# Patient Record
Sex: Male | Born: 1965 | Race: White | Hispanic: No | Marital: Married
Health system: Southern US, Community
[De-identification: ages and names within clinical notes are randomized; demographics above are authoritative.]

---

## 2020-12-01 ENCOUNTER — Other Ambulatory Visit: Payer: Self-pay

## 2020-12-01 ENCOUNTER — Emergency Department (HOSPITAL_COMMUNITY)
Admission: EM | Admit: 2020-12-01 | Discharge: 2020-12-02 | Disposition: A | Discharge status: Home / Self Care | Payer: BC Managed Care – PPO | Attending: Emergency Medicine | Admitting: Emergency Medicine

## 2020-12-01 DIAGNOSIS — N200 Calculus of kidney: Secondary | ICD-10-CM | POA: Insufficient documentation

## 2020-12-01 DIAGNOSIS — N4889 Other specified disorders of penis: Secondary | ICD-10-CM | POA: Insufficient documentation

## 2020-12-01 DIAGNOSIS — N138 Other obstructive and reflux uropathy: Secondary | ICD-10-CM

## 2020-12-01 DIAGNOSIS — N211 Calculus in urethra: Secondary | ICD-10-CM | POA: Diagnosis not present

## 2020-12-01 DIAGNOSIS — R339 Retention of urine, unspecified: Secondary | ICD-10-CM | POA: Diagnosis present

## 2020-12-01 MED ORDER — LIDOCAINE HCL URETHRAL/MUCOSAL 2 % EX GEL
1.0000 "application " | Freq: Once | CUTANEOUS | Status: AC
  Administered 2020-12-01: 1 via TOPICAL
  Filled 2020-12-01: qty 11

## 2020-12-01 MED ORDER — HYDROMORPHONE HCL 1 MG/ML IJ SOLN
1.0000 mg | Freq: Once | INTRAMUSCULAR | Status: AC
  Administered 2020-12-01: 1 mg via INTRAMUSCULAR
  Filled 2020-12-01: qty 1

## 2020-12-01 NOTE — ED Triage Notes (Signed)
Pt reported to ED with c/o of passing kidney stone x past week that has moved to tip of penis and obstructing urine flow.

## 2020-12-01 NOTE — ED Provider Notes (Signed)
Emergency Medicine Provider Triage Evaluation Note  Joshua Graham , a 55 y.o. male  was evaluated in triage.  Pt complains of KS stuck in urethral meatus.  Can't pass urine.  Complains of significant pain with manipulation of the stone.  Doesn't know how big the stone is.  Review of Systems  Positive: Penis pain, hematuria Negative: Fever, chills  Physical Exam  BP (!) 154/92 (BP Location: Right Arm)   Pulse 70   Temp 98 F (36.7 C) (Oral)   Resp 18   SpO2 98%  Gen:   Awake, no distress   Resp:  Normal effort  MSK:   Moves extremities without difficulty  Other:  Stone blocking urethral meatus  Medical Decision Making  Medically screening exam initiated at 10:32 PM.  Appropriate orders placed.  Keshav Winegar was informed that the remainder of the evaluation will be completed by another provider, this initial triage assessment does not replace that evaluation, and the importance of remaining in the ED until their evaluation is complete.  Foreign body   Roxy Horseman, PA-C 12/01/20 2254    Charlynne Pander, MD 12/01/20 [phone]

## 2020-12-01 NOTE — ED Provider Notes (Addendum)
[facility] EMERGENCY DEPARTMENT Provider Note  CSN: [id number] Arrival date & time: 12/01/20 2027  Chief Complaint(s) No chief complaint on file.  HPI Joshua Graham is a 55 y.o. male patient presents with a kidney stone stuck in the urethral meatus.  He was diagnosed with renal stone 1 week ago and has been passing it since.  While trying to pass today he got stuck at the meatus.  He is endorsing severe pain at the tip of the penis.  Worse with palpation.  Unable to urinate.  No swelling or redness.  No nausea or vomiting.  No fevers or chills.  No other physical complaints  HPI  Past Medical History No past medical history on file. There are no problems to display for this patient.  Home Medication(s) Prior to Admission medications   Medication Sig Start Date End Date Taking? Authorizing Provider  albuterol (VENTOLIN HFA) 108 (90 Base) MCG/ACT inhaler Inhale 1-2 puffs into the lungs every 6 (six) hours as needed for wheezing or shortness of breath.   Yes [provider]  Cholecalciferol (VITAMIN D3) 1.25 MG (50000 UT) CAPS Take 50,000 Units by mouth every 14 (fourteen) days. 07/06/20  Yes [provider]  cyclobenzaprine (FLEXERIL) 10 MG tablet Take 10 mg by mouth 2 (two) times daily as needed for muscle spasms.   Yes [provider]  Docusate Calcium (STOOL SOFTENER PO) Take 1 capsule by mouth daily.   Yes [provider]  fluticasone (FLONASE) 50 MCG/ACT nasal spray Place 1 spray into both nostrils daily as needed for allergies or rhinitis.   Yes [provider]  ibuprofen (ADVIL) 200 MG tablet Take 800 mg by mouth every 6 (six) hours as needed for headache or moderate pain.   Yes [provider]  ipratropium (ATROVENT) 0.06 % nasal spray Place 1 spray into both nostrils 2 (two) times daily as needed for rhinitis.   Yes [provider]  losartan (COZAAR) 100 MG tablet Take 100 mg by mouth daily. 11/15/20   Yes [provider]  metoprolol succinate (TOPROL-XL) 50 MG 24 hr tablet Take 25 mg by mouth every evening. 07/24/20  Yes [provider]  Omega 3-6-9 Fatty Acids (OMEGA-3-6-9 PO) Take 1 capsule by mouth in the morning and at bedtime.   Yes [provider]  omeprazole (PRILOSEC) 20 MG capsule Take 20 mg by mouth daily.   Yes [provider]  predniSONE (RAYOS) 5 MG TBEC Take 5 mg by mouth in the morning and at bedtime.   Yes [provider]  SUMAtriptan (IMITREX) 50 MG tablet Take 50 mg by mouth daily as needed for migraine or headache. 08/16/20  Yes [provider]  tamsulosin (FLOMAX) 0.4 MG CAPS capsule Take 0.8 mg by mouth daily. 11/05/20  Yes [provider]  Turmeric (QC TUMERIC COMPLEX PO) Take 1 capsule by mouth daily.   Yes [provider]  vitamin C (ASCORBIC ACID) 500 MG tablet Take 500 mg by mouth daily.   Yes [provider]  Past Surgical History ** The histories are not reviewed yet. Please review them in the "History" navigator section and refresh this SmartLink. Family History No family history on file.  Social History   Allergies Gabapentin, Aspirin, Morphine and related, Oxycodone, and Penicillins  Review of Systems Review of Systems All other systems are reviewed and are negative for acute change except as noted in the HPI  Physical Exam Vital Signs  I have reviewed the triage vital signs BP (!) 192/81 (BP Location: Right Arm)   Pulse 78   Temp 98 F (36.7 C) (Oral)   Resp 20   Ht 5\' 8"  (1.727 m)   Wt 90.7 kg   SpO2 96%   BMI 30.41 kg/m   Physical Exam Vitals reviewed.  Constitutional:      General: He is not in acute distress.    Appearance: He is well-developed. He is not diaphoretic.  HENT:     Head: Normocephalic and atraumatic.     Right Ear:  External ear normal.     Left Ear: External ear normal.     Nose: Nose normal.     Mouth/Throat:     Mouth: Mucous membranes are moist.  Eyes:     General: No scleral icterus.    Conjunctiva/sclera: Conjunctivae normal.  Neck:     Trachea: Phonation normal.  Cardiovascular:     Rate and Rhythm: Normal rate and regular rhythm.  Pulmonary:     Effort: Pulmonary effort is normal. No respiratory distress.     Breath sounds: No stridor.  Abdominal:     General: There is no distension.  Genitourinary:    Penis: Hypospadias present.      Comments: Stone partly exposed at the urethral meatus. Stuck and obstructing the exit. Musculoskeletal:        General: Normal range of motion.     Cervical back: Normal range of motion.  Neurological:     Mental Status: He is alert and oriented to person, place, and time.  Psychiatric:        Behavior: Behavior normal.    ED Results and Treatments Labs (all labs ordered are listed, but only abnormal results are displayed) Labs Reviewed  URINALYSIS, ROUTINE W REFLEX MICROSCOPIC - Abnormal; Notable for the following components:      Result Value   Ketones, ur 5 (*)    All other components within normal limits  CBC WITH DIFFERENTIAL/PLATELET - Abnormal; Notable for the following components:   Platelets 140 (*)    All other components within normal limits  BASIC METABOLIC PANEL - Abnormal; Notable for the following components:   Potassium 3.1 (*)    Glucose, Bld 100 (*)    All other components within normal limits                                                                                                                         EKG  EKG Interpretation  Date/Time:    Ventricular Rate:  PR Interval:    QRS Duration:   QT Interval:    QTC Calculation:   R Axis:     Text Interpretation:         Radiology DG Pelvis 1-2 Views  Result Date: 12/02/2020 CLINICAL DATA:  Urethral stone. EXAM: PELVIS - 1-2 VIEW COMPARISON:  None.  FINDINGS: Calcification noted near the tip of the penis measures 7 x 3 mm. No additional suspicious calcifications. Nonobstructive bowel gas pattern. No acute bony abnormality. IMPRESSION: 7 x 3 mm calcification near the tip of the penis. Electronically Signed   By: Charlett NoseKevin  Dover M.D.   On: 12/02/2020 00:19   CT Renal Stone Study  Result Date: 12/02/2020 CLINICAL DATA:  Urinary tract stone EXAM: CT ABDOMEN AND PELVIS WITHOUT CONTRAST TECHNIQUE: Multidetector CT imaging of the abdomen and pelvis was performed following the standard protocol without IV contrast. COMPARISON:  None. FINDINGS: Lower chest: Lung bases are clear. No effusions. Heart is normal size. Hepatobiliary: No focal hepatic abnormality. Gallbladder unremarkable. Pancreas: No focal abnormality or ductal dilatation. Spleen: No focal abnormality.  Normal size. Adrenals/Urinary Tract: Punctate nonobstructing stone in the lower pole of the left kidney. No ureteral stones or hydronephrosis. 3.1 cm exophytic low-density lesion off the lower pole of the right kidney, likely cyst. Adrenal glands and urinary bladder unremarkable. Stomach/Bowel: Stomach, large and small bowel grossly unremarkable. Normal appendix. Few scattered colonic diverticula. Vascular/Lymphatic: No evidence of aneurysm or adenopathy. Scattered aortic atherosclerosis. Reproductive: No visible focal abnormality. Other: No free fluid or free air. Left inguinal hernia containing fat. Musculoskeletal: No acute bony abnormality. IMPRESSION: Punctate left lower pole nephrolithiasis. No ureteral stones or hydronephrosis. Aortic atherosclerosis. Left inguinal hernia containing fat. Scattered colonic diverticula.  No active diverticulitis. Electronically Signed   By: Charlett NoseKevin  Dover M.D.   On: 12/02/2020 01:07    Pertinent labs & imaging results that were available during my care of the patient were reviewed by me and considered in my medical decision making (see MDM for details).  Medications  Ordered in ED Medications  lidocaine (XYLOCAINE) 2 % jelly 1 application (1 application Topical Given by Other 12/01/20 2354)  HYDROmorphone (DILAUDID) injection 1 mg (1 mg Intramuscular Given 12/01/20 2352)                                                                                                                                     Procedures .Foreign Body Removal  Date/Time: 12/02/2020 12:44 AM Performed by: Nira Connardama, Sky Borboa Eduardo, MD Authorized by: Nira Connardama, Saphronia Ozdemir Eduardo, MD  Consent: Verbal consent obtained. Risks and benefits: risks, benefits and alternatives were discussed Consent given by: patient Patient understanding: patient states understanding of the procedure being performed Imaging studies: imaging studies available Patient identity confirmed: verbally with patient and arm band Intake: penis/urethral.  Anesthesia: Local Anesthetic: topical anesthetic  Sedation: Patient sedated: no  Patient restrained: no 3 objects recovered. Objects recovered: urinary stones Post-procedure assessment: foreign  body removed Patient tolerance: patient tolerated the procedure well with no immediate complications Comments: Patient able to void approx 150cc. Post void residual approx 140cc    (including critical care time)  Medical Decision Making / ED Course I have reviewed the nursing notes for this encounter and the patient's prior records (if available in EHR or on provided paperwork).  Jacari Iannello was evaluated in Emergency Department on 12/02/2020 for the symptoms described in the history of present illness. He was evaluated in the context of the global COVID-19 pandemic, which necessitated consideration that the patient might be at risk for infection with the SARS-CoV-2 virus that causes COVID-19. Institutional protocols and algorithms that pertain to the evaluation of patients at risk for COVID-19 are in a state of rapid change based on information released by regulatory  bodies including the CDC and federal and state organizations. These policies and algorithms were followed during the patient's care in the ED.     Obstructing stone at the urethral meatus. Patient has hypospadia with a small caliber meatus. Attempt at removal was difficult. Topical lidocaine applied. Patient given IM Dilaudid. Plain film of the pelvis obtained to assess size and shape of the stone, in order to guide extraction.  Plain film notable for 7 x 3 mm calcified stone at the tip of the penis. I was able to extracted manually. Patient able to void approximately 150 cc afterwards. Post void residual approximately 130 to 140 cc.  UA sent. Screening labs to assess renal function. CT stone study ordered.  Pertinent labs & imaging results that were available during my care of the patient were reviewed by me and considered in my medical decision making:  CBC without leukocytosis or anemia. Metabolic panel without significant electrolyte derangements or renal sufficiency. UA without evidence of infection. CT scan notable for punctate stone in the left lower renal pole. No other stones in the bladder.   Final Clinical Impression(s) / ED Diagnoses Final diagnoses:  Urinary tract obstruction by kidney stone  Urethral stone   The patient appears reasonably screened and/or stabilized for discharge and I doubt any other medical condition or other [facility] requiring further screening, evaluation, or treatment in the ED at this time prior to discharge. Safe for discharge with strict return precautions.  Disposition: Discharge  Condition: Good  I have discussed the results, Dx and Tx plan with the patient/family who expressed understanding and agree(s) with the plan. Discharge instructions discussed at length. The patient/family was given strict return precautions who verbalized understanding of the instructions. No further questions at time of discharge.    ED Discharge Orders      None        Follow Up: [facility] [address] [place] [address] [phone] Call  to schedule an appointment for close follow up     This chart was dictated using voice recognition software.  Despite best efforts to proofread,  errors can occur which can change the documentation meaning.      Nira Conn, MD 12/02/20 [phone]

## 2020-12-02 ENCOUNTER — Emergency Department (HOSPITAL_COMMUNITY): Payer: BC Managed Care – PPO

## 2020-12-02 LAB — URINALYSIS, ROUTINE W REFLEX MICROSCOPIC
Bilirubin Urine: NEGATIVE
Glucose, UA: NEGATIVE mg/dL
Hgb urine dipstick: NEGATIVE
Ketones, ur: 5 mg/dL — AB
Leukocytes,Ua: NEGATIVE
Nitrite: NEGATIVE
Protein, ur: NEGATIVE mg/dL
Specific Gravity, Urine: 1.021 (ref 1.005–1.030)
pH: 5 (ref 5.0–8.0)

## 2020-12-02 LAB — BASIC METABOLIC PANEL
Anion gap: 9 (ref 5–15)
BUN: 20 mg/dL (ref 6–20)
CO2: 26 mmol/L (ref 22–32)
Calcium: 9 mg/dL (ref 8.9–10.3)
Chloride: 105 mmol/L (ref 98–111)
Creatinine, Ser: 1.14 mg/dL (ref 0.61–1.24)
GFR, Estimated: >60 mL/min (ref >60)
Glucose, Bld: 100 mg/dL — ABNORMAL HIGH (ref 70–99)
Potassium: 3.1 mmol/L — ABNORMAL LOW (ref 3.5–5.1)
Sodium: 140 mmol/L (ref 135–145)

## 2020-12-02 LAB — CBC WITH DIFFERENTIAL/PLATELET
Abs Immature Granulocytes: 0.02 10*3/uL (ref 0.00–0.07)
Basophils Absolute: 0 10*3/uL (ref 0.0–0.1)
Basophils Relative: 0 %
Eosinophils Absolute: 0.1 10*3/uL (ref 0.0–0.5)
Eosinophils Relative: 2 %
HCT: 39.9 % (ref 39.0–52.0)
Hemoglobin: 13.7 g/dL (ref 13.0–17.0)
Immature Granulocytes: 0 %
Lymphocytes Relative: 33 %
Lymphs Abs: 2.1 10*3/uL (ref 0.7–4.0)
MCH: 32.3 pg (ref 26.0–34.0)
MCHC: 34.3 g/dL (ref 30.0–36.0)
MCV: 94.1 fL (ref 80.0–100.0)
Monocytes Absolute: 0.5 10*3/uL (ref 0.1–1.0)
Monocytes Relative: 8 %
Neutro Abs: 3.5 10*3/uL (ref 1.7–7.7)
Neutrophils Relative %: 57 %
Platelets: 140 10*3/uL — ABNORMAL LOW (ref 150–400)
RBC: 4.24 MIL/uL (ref 4.22–5.81)
RDW: 12.5 % (ref 11.5–15.5)
WBC: 6.3 10*3/uL (ref 4.0–10.5)
nRBC: 0 % (ref 0.0–0.2)

## 2020-12-02 NOTE — ED Notes (Signed)
146mg  BLADDER SCAN

## 2020-12-02 NOTE — ED Notes (Signed)
Provider at bedside with patient.

## 2022-12-18 IMAGING — DX DG PELVIS 1-2V
2 series · 2 of 2 positions shown · non-contrast
Comparison: None.

CLINICAL DATA: Urethral stone.

EXAM:
PELVIS - 1-2 VIEW

[pelvis ap (1 of 2)]
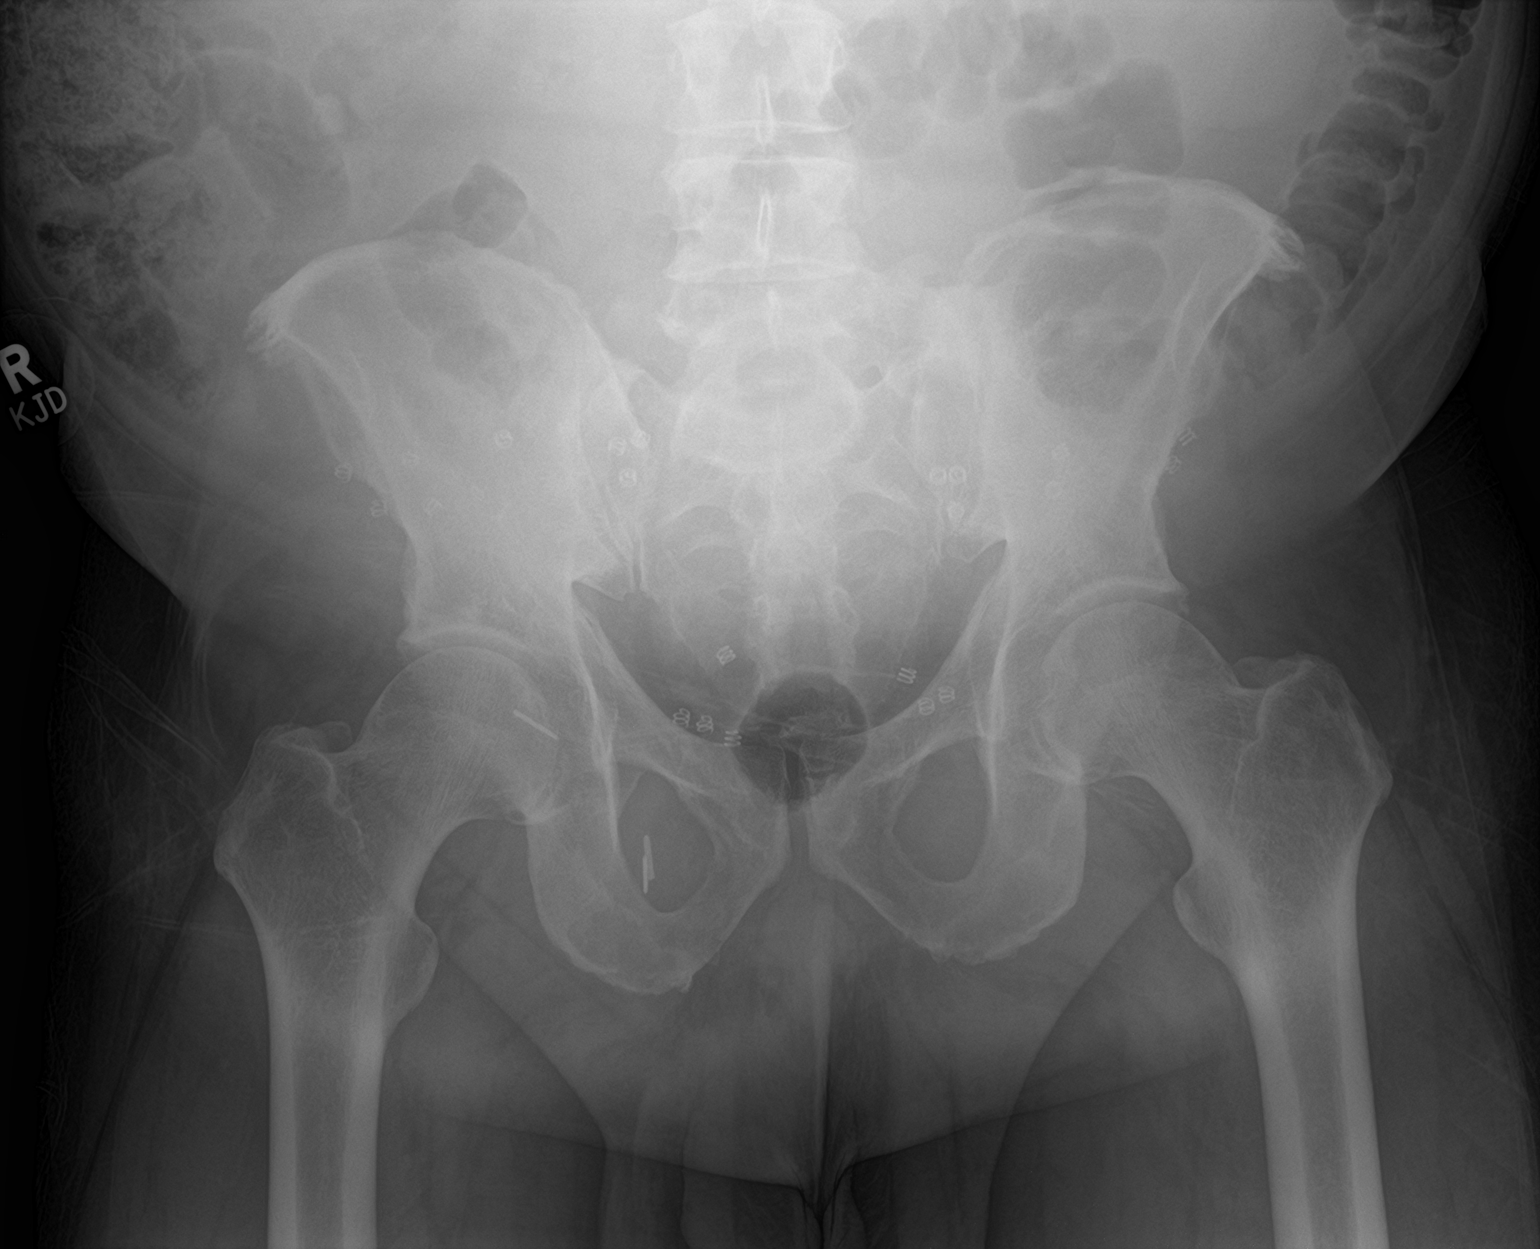

[pelvis ap (2 of 2)]
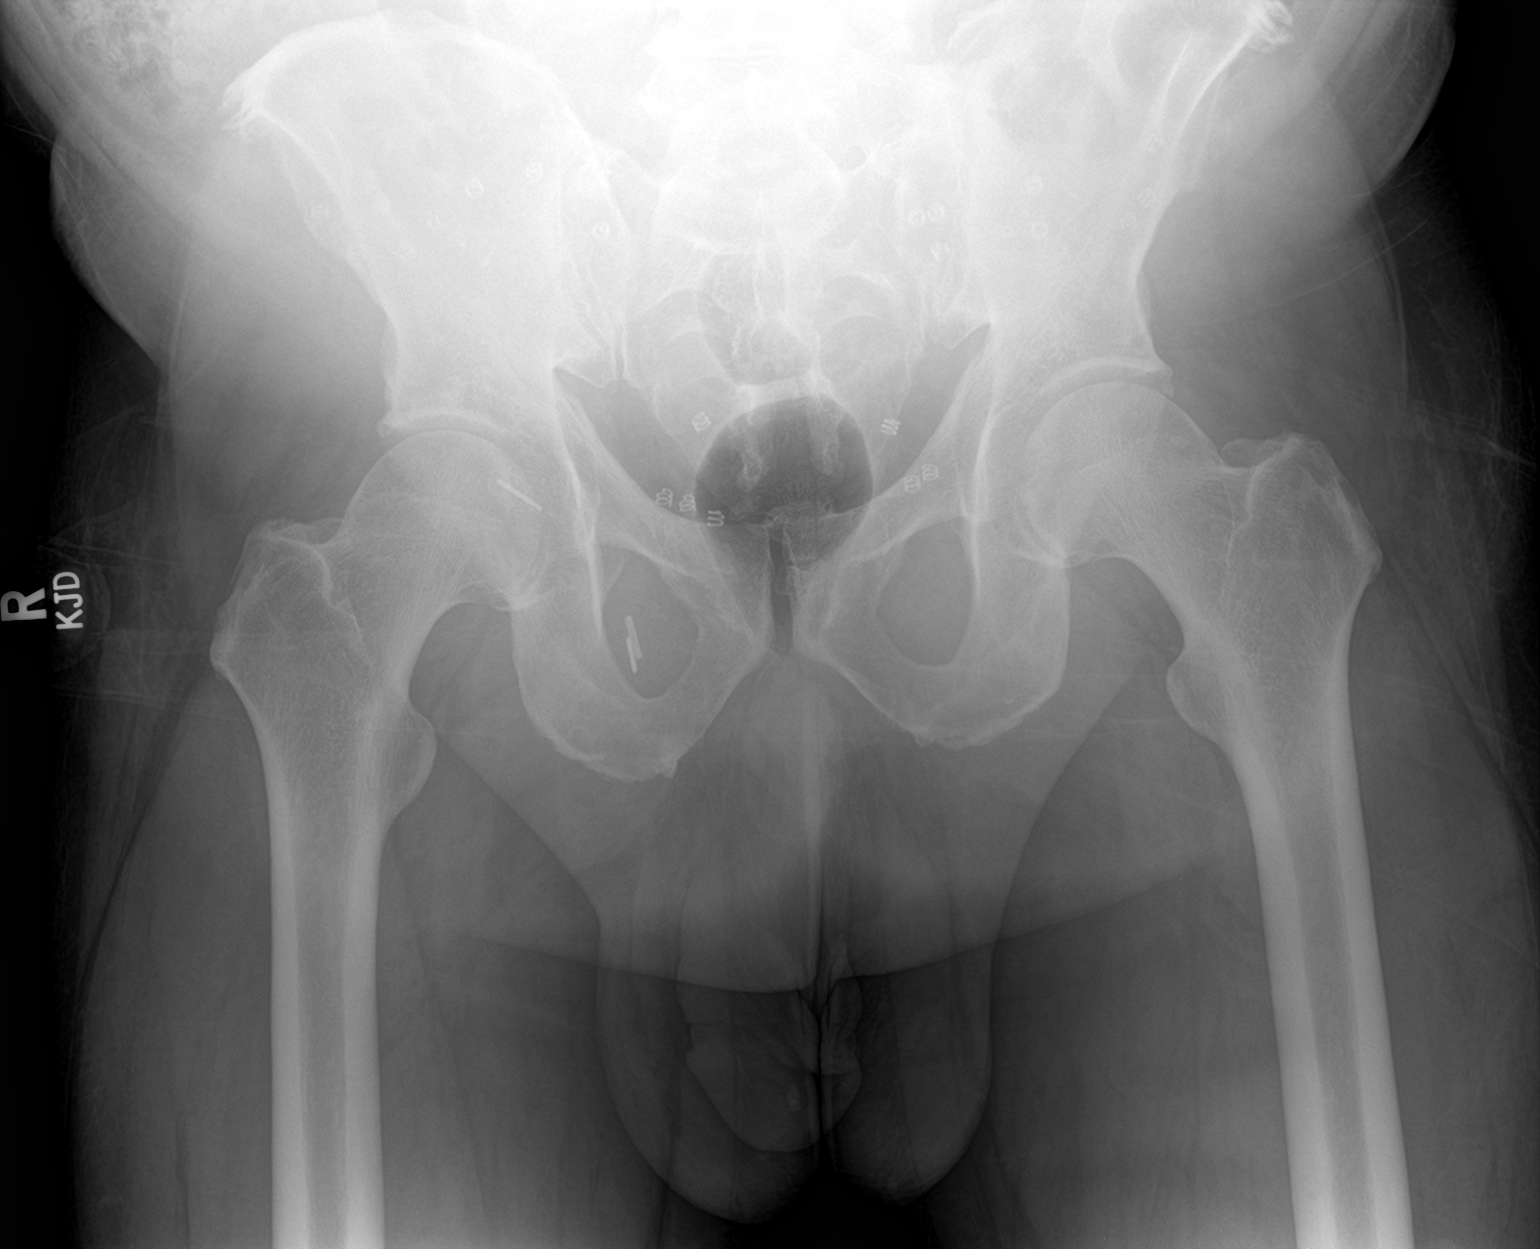

[2 of 2 positions shown; findings below may reference images not displayed]

FINDINGS: Calcification noted near the tip of the penis measures 7 x 3 mm. No
additional suspicious calcifications. Nonobstructive bowel gas
pattern. No acute bony abnormality.
IMPRESSION: 7 x 3 mm calcification near the tip of the penis.

## 2022-12-19 IMAGING — CT CT RENAL STONE PROTOCOL
2 of 4 series · 16 of 46 positions shown, 18 images · non-contrast
Comparison: None.

CLINICAL DATA: Urinary tract stone

EXAM:
CT ABDOMEN AND PELVIS WITHOUT CONTRAST
TECHNIQUE: Multidetector CT imaging of the abdomen and pelvis was performed
following the standard protocol without IV contrast.

[Series 3: ap without · axial · non-contrast · 0.87mm/px · z∈[+704,+1159]mm · 13 of 103 slices shown, 15 images]
[im 6/103  soft-tissue]
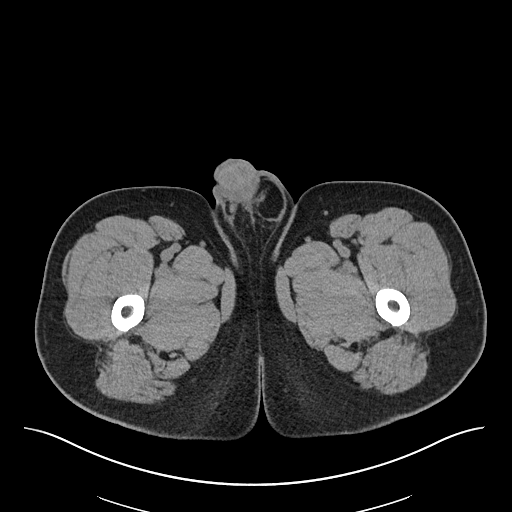
[im 6/103  bone]
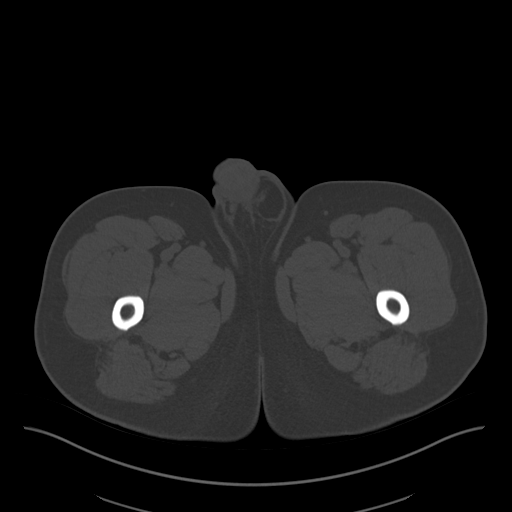
[im 12/103  soft-tissue]
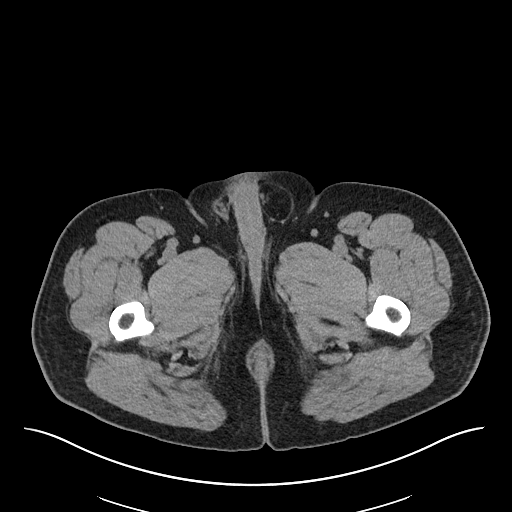
[im 23/103  soft-tissue]
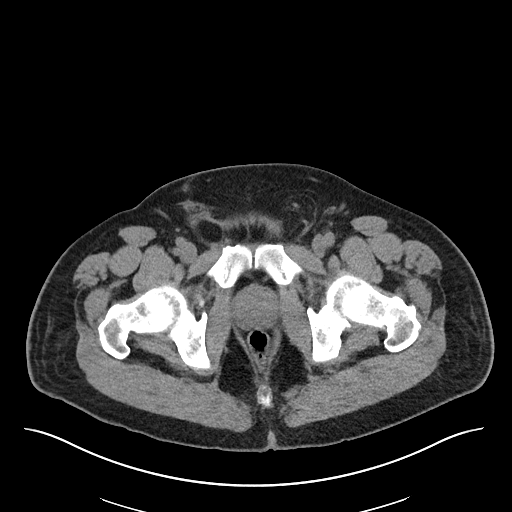
[im 29/103  soft-tissue]
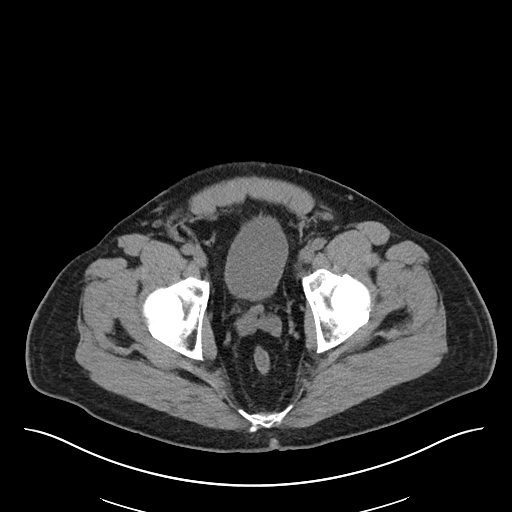
[im 35/103  soft-tissue]
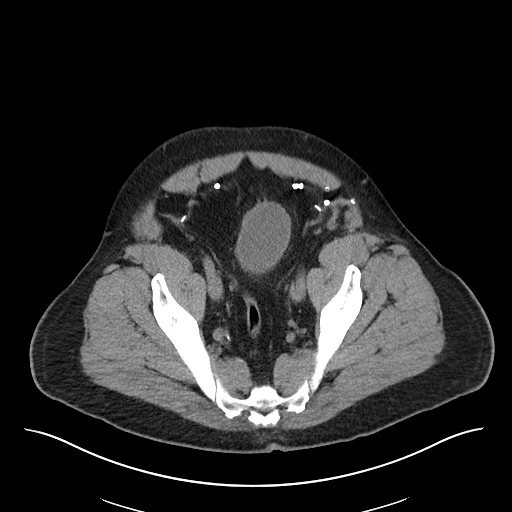
[im 46/103  soft-tissue]
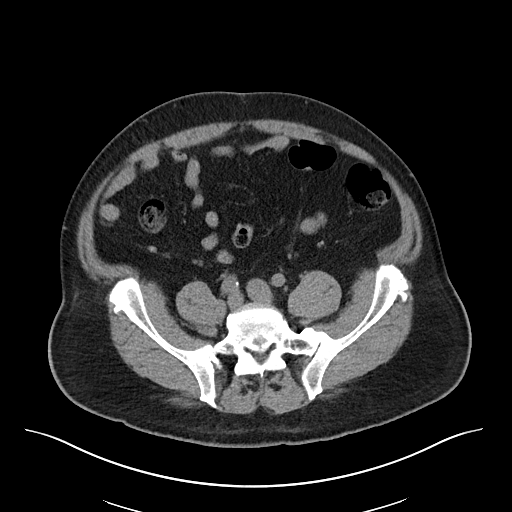
[im 52/103  soft-tissue]
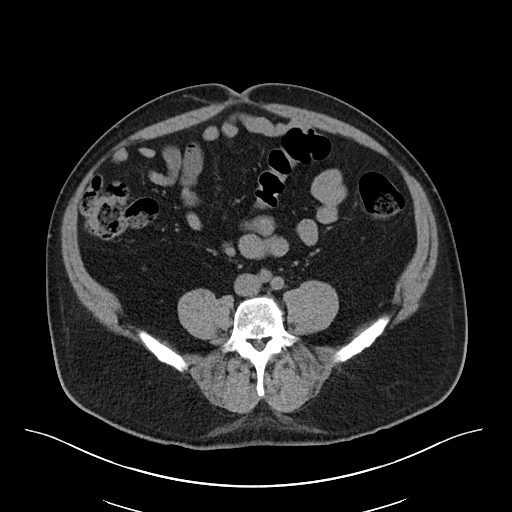
[im 57/103  soft-tissue]
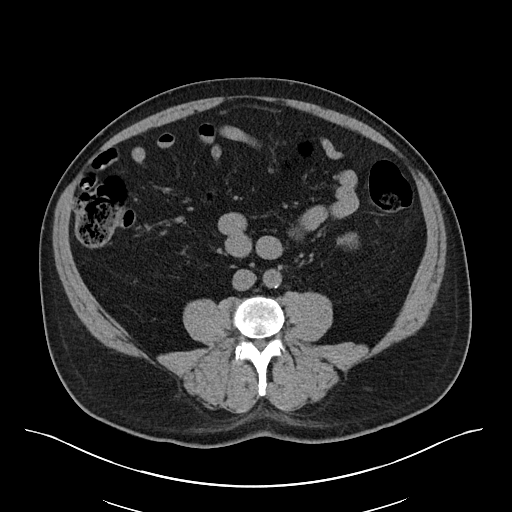
[im 69/103  soft-tissue]
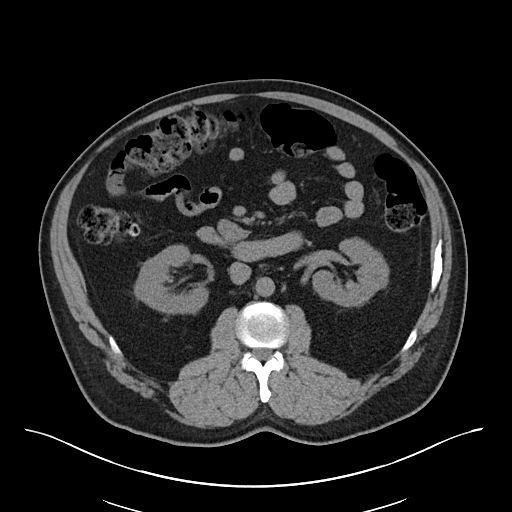
[im 69/103  bone]
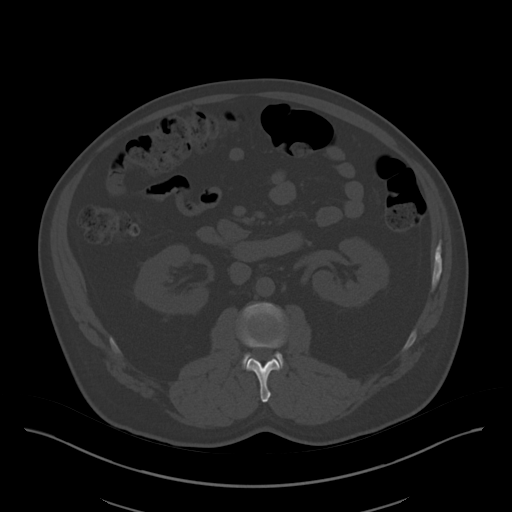
[im 74/103  soft-tissue]
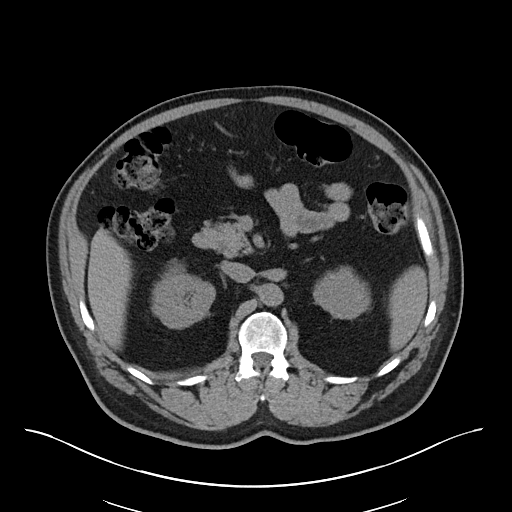
[im 80/103  soft-tissue]
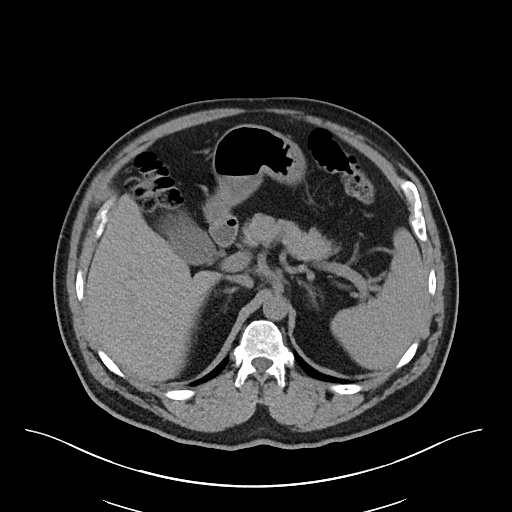
[im 91/103  soft-tissue]
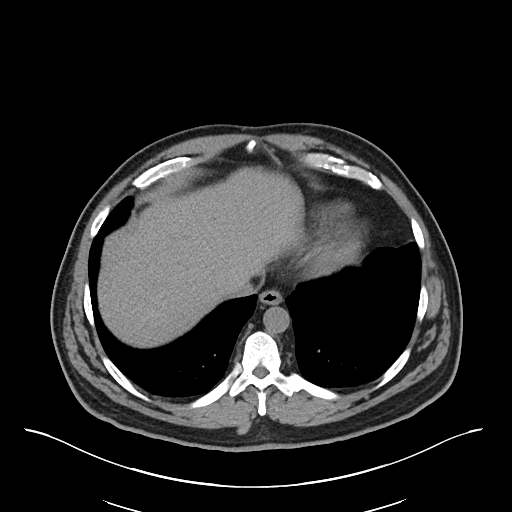
[im 97/103  soft-tissue]
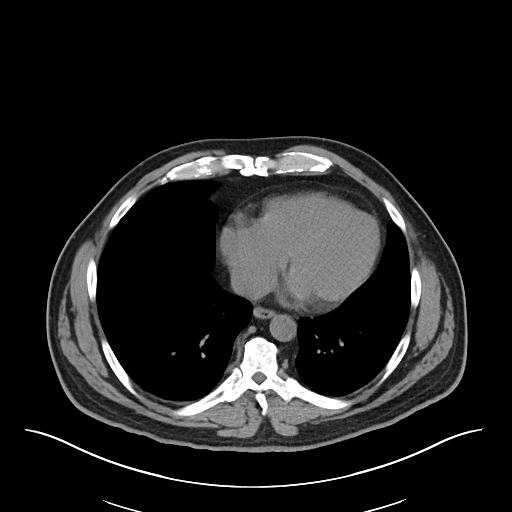

[Series 6: cor · coronal · 0.78mm/px · 3 of 112 slices shown]
[im 38/112  soft-tissue]
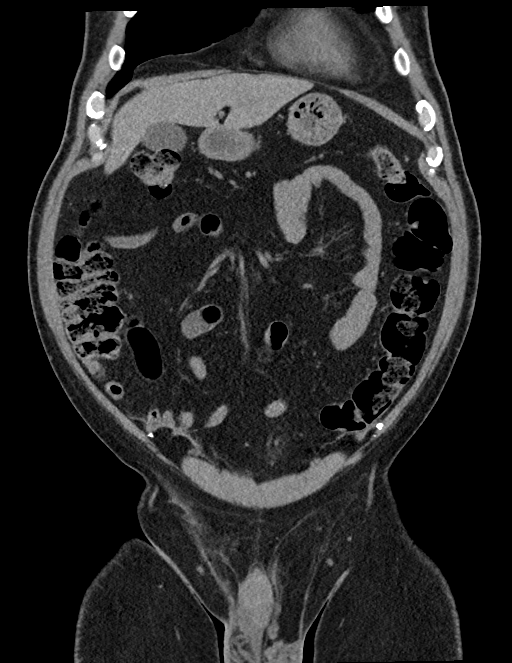
[im 50/112  soft-tissue]
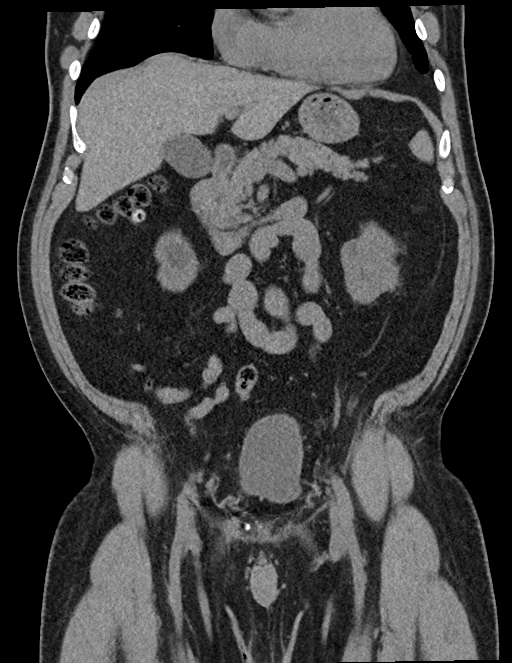
[im 62/112  soft-tissue]
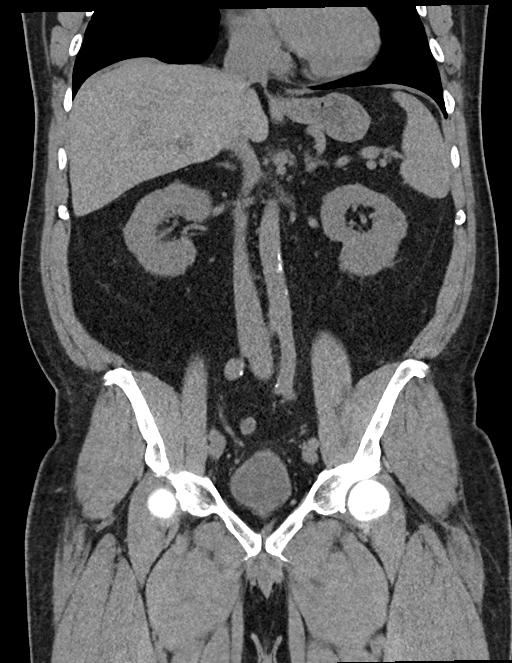

[16 of 46 positions shown; findings below may reference images not displayed]

FINDINGS: Lower chest: Lung bases are clear. No effusions. Heart is normal
size.

Hepatobiliary: No focal hepatic abnormality. Gallbladder
unremarkable.

Pancreas: No focal abnormality or ductal dilatation.

Spleen: No focal abnormality.  Normal size.

Adrenals/Urinary Tract: Punctate nonobstructing stone in the lower
pole of the left kidney. No ureteral stones or hydronephrosis.
cm exophytic low-density lesion off the lower pole of the right
kidney, likely cyst. Adrenal glands and urinary bladder
unremarkable.

Stomach/Bowel: Stomach, large and small bowel grossly unremarkable.
Normal appendix. Few scattered colonic diverticula.

Vascular/Lymphatic: No evidence of aneurysm or adenopathy. Scattered
aortic atherosclerosis.

Reproductive: No visible focal abnormality.

Other: No free fluid or free air. Left inguinal hernia containing
fat.

Musculoskeletal: No acute bony abnormality.
IMPRESSION: Punctate left lower pole nephrolithiasis. No ureteral stones or
hydronephrosis.

Aortic atherosclerosis.

Left inguinal hernia containing fat.

Scattered colonic diverticula.  No active diverticulitis.
